# Patient Record
Sex: Male | Born: 1990 | Race: Black or African American | Hispanic: No | Marital: Single | Smoking: Former smoker
Health system: Southern US, Community
[De-identification: ages and names within clinical notes are randomized; demographics above are authoritative.]

## PROBLEM LIST (undated history)

## (undated) ENCOUNTER — Ambulatory Visit: Payer: Self-pay

## (undated) DIAGNOSIS — T7840XA Allergy, unspecified, initial encounter: Secondary | ICD-10-CM

## (undated) HISTORY — PX: ARTHROSCOPIC REPAIR ACL: SUR80

## (undated) HISTORY — DX: Allergy, unspecified, initial encounter: T78.40XA

---
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
## 2009-02-08 ENCOUNTER — Emergency Department (HOSPITAL_COMMUNITY): Admission: EM | Admit: 2009-02-08 | Discharge: 2009-02-08 | Payer: Self-pay | Admitting: Family Medicine

## 2010-05-19 LAB — POCT RAPID STREP A (OFFICE): Streptococcus, Group A Screen (Direct): POSITIVE — AB

## 2014-01-13 ENCOUNTER — Encounter (HOSPITAL_COMMUNITY): Payer: Self-pay | Admitting: Nurse Practitioner

## 2014-01-13 ENCOUNTER — Emergency Department (HOSPITAL_COMMUNITY)
Admission: EM | Admit: 2014-01-13 | Discharge: 2014-01-14 | Disposition: A | Discharge status: Home / Self Care | Payer: Self-pay | Attending: Emergency Medicine | Admitting: Emergency Medicine

## 2014-01-13 DIAGNOSIS — Z87891 Personal history of nicotine dependence: Secondary | ICD-10-CM | POA: Insufficient documentation

## 2014-01-13 DIAGNOSIS — R1031 Right lower quadrant pain: Secondary | ICD-10-CM | POA: Insufficient documentation

## 2014-01-13 DIAGNOSIS — R1903 Right lower quadrant abdominal swelling, mass and lump: Secondary | ICD-10-CM | POA: Insufficient documentation

## 2014-01-13 DIAGNOSIS — R1030 Lower abdominal pain, unspecified: Secondary | ICD-10-CM

## 2014-01-13 NOTE — ED Notes (Signed)
Pt with c/o bilateral groin pain and swelling, onset yesterday, denies fever or chills, trauma. Rates pain 6/10 post ibuprofen prior to arrival. Pt in NAD, comfortable, testing on his cell phone.

## 2014-01-14 ENCOUNTER — Emergency Department (HOSPITAL_COMMUNITY): Payer: Self-pay

## 2014-01-14 LAB — BASIC METABOLIC PANEL
Anion gap: 14 (ref 5–15)
BUN: 16 mg/dL (ref 6–23)
CO2: 26 meq/L (ref 19–32)
CREATININE: 1.14 mg/dL (ref 0.50–1.35)
Calcium: 9.4 mg/dL (ref 8.4–10.5)
Chloride: 101 mEq/L (ref 96–112)
GFR calc non Af Amer: 89 mL/min — ABNORMAL LOW (ref >90)
Glucose, Bld: 96 mg/dL (ref 70–99)
Potassium: 3.9 mEq/L (ref 3.7–5.3)
Sodium: 141 mEq/L (ref 137–147)

## 2014-01-14 LAB — CBC WITH DIFFERENTIAL/PLATELET
BASOS ABS: 0 10*3/uL (ref 0.0–0.1)
BASOS PCT: 0 % (ref 0–1)
EOS ABS: 0.1 10*3/uL (ref 0.0–0.7)
EOS PCT: 1 % (ref 0–5)
HCT: 42.6 % (ref 39.0–52.0)
Hemoglobin: 15 g/dL (ref 13.0–17.0)
Lymphocytes Relative: 28 % (ref 12–46)
Lymphs Abs: 2 10*3/uL (ref 0.7–4.0)
MCH: 31.5 pg (ref 26.0–34.0)
MCHC: 35.2 g/dL (ref 30.0–36.0)
MCV: 89.5 fL (ref 78.0–100.0)
MONO ABS: 1 10*3/uL (ref 0.1–1.0)
Monocytes Relative: 13 % — ABNORMAL HIGH (ref 3–12)
Neutro Abs: 4.3 10*3/uL (ref 1.7–7.7)
Neutrophils Relative %: 58 % (ref 43–77)
PLATELETS: 156 10*3/uL (ref 150–400)
RBC: 4.76 MIL/uL (ref 4.22–5.81)
RDW: 12.3 % (ref 11.5–15.5)
WBC: 7.3 10*3/uL (ref 4.0–10.5)

## 2014-01-14 LAB — URINALYSIS, ROUTINE W REFLEX MICROSCOPIC
Bilirubin Urine: NEGATIVE
GLUCOSE, UA: NEGATIVE mg/dL
Hgb urine dipstick: NEGATIVE
Ketones, ur: NEGATIVE mg/dL
NITRITE: NEGATIVE
PH: 6 (ref 5.0–8.0)
Protein, ur: NEGATIVE mg/dL
Specific Gravity, Urine: 1.016 (ref 1.005–1.030)
UROBILINOGEN UA: 1 mg/dL (ref 0.0–1.0)

## 2014-01-14 LAB — URINE MICROSCOPIC-ADD ON

## 2014-01-14 MED ORDER — IOHEXOL 300 MG/ML  SOLN
100.0000 mL | Freq: Once | INTRAMUSCULAR | Status: AC | PRN
  Administered 2014-01-14: 100 mL via INTRAVENOUS

## 2014-01-14 MED ORDER — OXYCODONE-ACETAMINOPHEN 5-325 MG PO TABS: 1.0000 | ORAL_TABLET | Freq: Once | ORAL | Status: DC

## 2014-01-14 MED ORDER — MORPHINE SULFATE 4 MG/ML IJ SOLN
4.0000 mg | Freq: Once | INTRAMUSCULAR | Status: AC
  Administered 2014-01-14: 4 mg via INTRAVENOUS
  Filled 2014-01-14: qty 1

## 2014-01-14 MED ORDER — AZITHROMYCIN 250 MG PO TABS
1000.0000 mg | ORAL_TABLET | Freq: Once | ORAL | Status: AC
  Administered 2014-01-14: 1000 mg via ORAL
  Filled 2014-01-14: qty 4

## 2014-01-14 MED ORDER — OXYCODONE-ACETAMINOPHEN 5-325 MG PO TABS: 1.0000 | ORAL_TABLET | ORAL | Status: DC | PRN

## 2014-01-14 MED ORDER — CEFTRIAXONE SODIUM 250 MG IJ SOLR
250.0000 mg | Freq: Once | INTRAMUSCULAR | Status: AC
  Administered 2014-01-14: 250 mg via INTRAMUSCULAR
  Filled 2014-01-14: qty 250

## 2014-01-14 MED ORDER — LIDOCAINE HCL 1 % IJ SOLN
INTRAMUSCULAR | Status: AC
  Administered 2014-01-14: 0.9 mL via INTRAMUSCULAR
  Filled 2014-01-14: qty 20

## 2014-01-14 NOTE — ED Notes (Signed)
Awake. Pt reported bil groin pain with palpation with swelling on rt side. Onset of pain started prior to taking shower and removing pants. Denies emesis/diarrhea. Reported nausea and lightheadedness with pain. Denies heavy lifting.

## 2014-01-14 NOTE — Discharge Instructions (Signed)

## 2014-01-14 NOTE — ED Notes (Signed)
MD at bedside. 

## 2014-01-14 NOTE — ED Notes (Signed)
Awake. Verbally responsive. A/O x4. Resp even and unlabored. No audible adventitious breath sounds noted. ABC's intact. NAD noted. 

## 2014-01-14 NOTE — ED Notes (Signed)
Resting quietly with eye closed. Easily arousable. Verbally responsive. Resp even and unlabored. ABC's intact. NAD noted.  

## 2014-01-14 NOTE — ED Notes (Signed)
Pt had no adverse reaction to IM ABT.

## 2014-01-14 NOTE — ED Notes (Signed)
Patient returned from CT scan without distress noted. 

## 2014-01-14 NOTE — ED Provider Notes (Signed)
CSN: [id number]     Arrival date & time 01/13/14  2200 History   First MD Initiated Contact with Patient 01/14/14 0117     Chief Complaint  Patient presents with  . Groin Pain  . Groin Swelling     (Consider location/radiation/quality/duration/timing/severity/associated sxs/prior Treatment) Patient is a 23 y.o. male presenting with groin pain. The history is provided by the patient. No language interpreter was used.  Groin Pain This is a new problem. Pertinent negatives include no abdominal pain, chills, fever, nausea or vomiting. Associated symptoms comments: Pain and swelling in right groin for the past day. No fever. No dysuria, hematuria, scrotal swelling or testicular pain. He denies penile discharge. No abdominal pain or nausea/vomiting.Marland Kitchen.    History reviewed. No pertinent past medical history. History reviewed. No pertinent past surgical history. History reviewed. No pertinent family history. History  Substance Use Topics  . Smoking status: Former Games developermoker  . Smokeless tobacco: Never Used  . Alcohol Use: No    Review of Systems  Constitutional: Negative for fever and chills.  Gastrointestinal: Negative.  Negative for nausea, vomiting and abdominal pain.  Genitourinary: Negative for dysuria, hematuria, discharge, scrotal swelling, penile pain and testicular pain.       See HPI.  Musculoskeletal: Negative.   Skin: Negative.   Neurological: Negative.       Allergies  Shellfish allergy  Home Medications   Prior to Admission medications   Not on File   BP 118/69 mmHg  Pulse 57  Temp(Src) 98.3 F (36.8 C) (Oral)  Resp 18  Ht 6\' 2"  (1.88 m)  Wt 165 lb (74.844 kg)  BMI 21.18 kg/m2  SpO2 100% Physical Exam  Constitutional: He is oriented to person, place, and time. He appears well-developed and well-nourished.  Neck: Normal range of motion.  Pulmonary/Chest: Effort normal.  Abdominal: There is no tenderness.  Genitourinary:  Circumcised. No scrotal swelling or  testicular tenderness. Right groin at base of penis is swollen, significantly tender. Exam limited by tenderness, but no discrete mass, or fluctuance appreciated.   Musculoskeletal: Normal range of motion.  Neurological: He is alert and oriented to person, place, and time.  Skin: Skin is warm and dry.  Psychiatric: He has a normal mood and affect.    ED Course  Procedures (including critical care time) Labs Review Labs Reviewed  URINALYSIS, ROUTINE W REFLEX MICROSCOPIC - Abnormal; Notable for the following:    APPearance CLOUDY (*)    Leukocytes, UA MODERATE (*)    All other components within normal limits  CBC WITH DIFFERENTIAL - Abnormal; Notable for the following:    Monocytes Relative 13 (*)    All other components within normal limits  BASIC METABOLIC PANEL - Abnormal; Notable for the following:    GFR calc non Af Amer 89 (*)    All other components within normal limits  GC/CHLAMYDIA PROBE AMP  URINE MICROSCOPIC-ADD ON    Imaging Review Ct Pelvis W Contrast  01/14/2014   CLINICAL DATA:  Acute onset of right groin swelling and bilateral groin pain. Initial encounter.  EXAM: CT PELVIS WITH CONTRAST  TECHNIQUE: Multidetector CT imaging of the pelvis was performed using the standard protocol following the bolus administration of intravenous contrast.  CONTRAST:  100mL OMNIPAQUE IOHEXOL 300 MG/ML  SOLN  COMPARISON:  None.  FINDINGS: Asymmetrically prominent right inguinal nodes are seen, measuring up to 1.1 cm in short axis. There is mild associated soft tissue inflammation. This is of uncertain significance. There may be mild  edema with regard to the adjacent right inguinal canal.  There is slight asymmetric prominence of the right side of the scrotum, raising question for mild scrotal wall edema or possibly epididymitis. Would correlate for associated symptoms.  There is no evidence of vascular compromise. The visualized portions of the pelvis are unremarkable in appearance. No pelvic  sidewall lymphadenopathy is seen. No free fluid is identified. Visualized small and large bowel loops are grossly unremarkable. The appendix is normal in caliber, without evidence for appendicitis. The prostate is grossly unremarkable. The bladder is mildly distended and within normal limits.  No acute osseous abnormalities are seen. The visualized musculature is unremarkable in appearance.  IMPRESSION: 1. Asymmetrically prominent right inguinal nodes, measuring up to 1.1 cm in short axis. Mild associated soft tissue inflammation, of uncertain significance. There may be mild edema with regard to the adjacent right inguinal canal. 2. Slight asymmetric prominence of the right side of the scrotum, raising question for mild scrotal wall edema or possibly epididymitis. Would correlate for associated symptoms. 3. Otherwise unremarkable CT of the pelvis.   Electronically Signed   By: Roanna RaiderJeffery  Chang M.D.   On: 01/14/2014 03:58     EKG Interpretation None      MDM   Final diagnoses:  Groin pain    GC/Chlamydia cultures pending. CT scan showing adenopathy without abscess or hernia. Zithromax and Rocephin provided. Will also provide pain management. All questions of patient answered.     Arnoldo HookerShari A Jeanet Lupe, PA-C 01/14/14 0500  Purvis SheffieldForrest Harrison, MD 01/14/14 [id number]

## 2014-01-14 NOTE — ED Notes (Signed)
Patient transported to CT 

## 2014-01-16 LAB — GC/CHLAMYDIA PROBE AMP
CT Probe RNA: POSITIVE — AB
GC Probe RNA: NEGATIVE

## 2014-01-17 ENCOUNTER — Telehealth: Payer: Self-pay | Admitting: Emergency Medicine

## 2014-01-17 NOTE — Telephone Encounter (Signed)
pt returned call. ID verified. Notified of positive Chlamydia and that treatment was given while in ED with Rocephin and zithromax. STD instructions provided, pt verbalized understanding.

## 2014-01-17 NOTE — Telephone Encounter (Signed)
Positive Chlamydia Treated with Rocephin and Zithromax per protocol MD [facility] faxed  Left message for patient to call office #

## 2014-04-15 ENCOUNTER — Encounter (HOSPITAL_COMMUNITY): Payer: Self-pay | Admitting: Physical Medicine and Rehabilitation

## 2014-04-15 ENCOUNTER — Emergency Department (HOSPITAL_COMMUNITY)
Admission: EM | Admit: 2014-04-15 | Discharge: 2014-04-15 | Disposition: A | Discharge status: Home / Self Care | Payer: Self-pay | Attending: Emergency Medicine | Admitting: Emergency Medicine

## 2014-04-15 DIAGNOSIS — Z87891 Personal history of nicotine dependence: Secondary | ICD-10-CM | POA: Insufficient documentation

## 2014-04-15 DIAGNOSIS — Y92310 Basketball court as the place of occurrence of the external cause: Secondary | ICD-10-CM | POA: Insufficient documentation

## 2014-04-15 DIAGNOSIS — W500XXA Accidental hit or strike by another person, initial encounter: Secondary | ICD-10-CM | POA: Insufficient documentation

## 2014-04-15 DIAGNOSIS — Y998 Other external cause status: Secondary | ICD-10-CM | POA: Insufficient documentation

## 2014-04-15 DIAGNOSIS — S01111A Laceration without foreign body of right eyelid and periocular area, initial encounter: Secondary | ICD-10-CM | POA: Insufficient documentation

## 2014-04-15 DIAGNOSIS — Y9367 Activity, basketball: Secondary | ICD-10-CM | POA: Insufficient documentation

## 2014-04-15 NOTE — ED Notes (Signed)
Pt awake, talking more, smiling, Wanting to leave to go get something to eat and bandaid to cover wound.  Bandaid applied.

## 2014-04-15 NOTE — Discharge Instructions (Signed)
Open Wound, Forehead °An open wound is a break in the skin because of an injury. An open wound can be a scrape, cut, or puncture to the skin. Good wound care will help to:  °· Reduce pain. °· Prevent infection. °· Reduce scaring. °HOME CARE °· Wash all dirt off the wound. °· Clean your wound daily with gentle soap and water. °· Wash your hair as you normally do. °· Apply medicated cream after the wound has been cleaned or as told by your doctor. °· Apply a clean bandage (dressing) daily if necessary. °GET HELP RIGHT AWAY IF:  °· There is increased redness or puffiness (swelling) in or around the wound. °· Your pain increases. °· You or your child has a temperature by mouth above 102° F (38.9° C), not controlled by medicine. °· Your baby is older than 3 months with a rectal temperature of 102° F (38.9° C) or higher. °· Your baby is 3 months old or younger with a rectal temperature of 100.4° F (38° C) or higher. °· A yellowish white fluid (pus) comes from the wound. °· Your pain is not controlled with pain medicine. °· There is red streaking of the skin that goes above or below the wound. °MAKE SURE YOU:  °· Understand these instructions. °· Will watch your condition. °· Will get help right away if you are not doing well or get worse. °Document Released: 05/01/2008 Document Revised: 04/27/2011 Document Reviewed: 05/01/2008 °Exit[employer]® Patient Information ©2015 [employer]. This information is not intended to replace advice given to you by your health care provider. Make sure you discuss any questions you have with your health care provider. ° °

## 2014-04-15 NOTE — ED Provider Notes (Signed)
CSN: [id number]     Arrival date & time 04/15/14  1844 History  This chart was scribed for non-physician practitioner, Jose Helper, PA-C,working with Jose Roller, MD, by Jose Mullins, ED Scribe. This patient was seen in room TR04C/TR04C and the patient's care was started at 8:13 PM.  Chief Complaint  Patient presents with  . Laceration   Patient is a 24 y.o. male presenting with skin laceration. The history is provided by the patient and medical records. No language interpreter was used.  Laceration   HPI Comments:  Jose Mullins is a 24 y.o. male who presents to the Emergency Department complaining of a laceration to the right eyebrow that he sustained while playing basketball approximately three hours ago. Pt states another player's head came into contact with his and caused the laceration. He reports a severe headache and facial pain. Pt reports being very lethargic and tired. Denies modifying factors. Pt is unaware of the last tetanus vaccination he received. Denies LOC, loss of vision, neck pain, back pain, nausea or vomiting. He denies falling to the ground. Denies any illicit drug use or alcohol use. Pt is ambulatory without issue.  History reviewed. No pertinent past medical history. History reviewed. No pertinent past surgical history. No family history on file. History  Substance Use Topics  . Smoking status: Former Games developer  . Smokeless tobacco: Never Used  . Alcohol Use: No    Review of Systems  Skin: Positive for wound.  Neurological: Negative for syncope.    Allergies  Shellfish allergy  Home Medications   Prior to Admission medications   Medication Sig Start Date End Date Taking? Authorizing Provider  oxyCODONE-acetaminophen (PERCOCET/ROXICET) 5-325 MG per tablet Take 1-2 tablets by mouth every 4 (four) hours as needed for severe pain. 01/14/14   Arnoldo Hooker, PA-C   Triage Vitals: BP 111/69 mmHg  Pulse 82  Temp(Src) 98.8 F (37.1 C) (Oral)  Resp 18   SpO2 98% Physical Exam  Constitutional: He is oriented to person, place, and time. He appears well-developed and well-nourished.  HENT:  Head: Normocephalic.  2 cm superficial laceration noted to right eyebrow. No foreign object noted.  Eyes: EOM are normal.  Neck: Normal range of motion.  Cardiovascular: Normal rate.   Pulmonary/Chest: Effort normal.  Musculoskeletal: Normal range of motion.  Neurological: He is alert and oriented to person, place, and time.  Normal gait. No focal neural deficits. Able to answer all questions appropriately.  Skin: Skin is warm and dry.  Psychiatric: He has a normal mood and affect. His behavior is normal.  Nursing note and vitals reviewed.   ED Course  Procedures (including critical care time) DIAGNOSTIC STUDIES: Oxygen Saturation is 98% on RA, normal by my interpretation.   COORDINATION OF CARE: 8:19 PM- Offered a CT scan of the head but pt declined. Will give Ibuprofen prior to discharge and clean wound and apply steri-strips. Pt verbalizes understanding and agrees to plan.  Medications - No data to display  Labs Review Labs Reviewed - No data to display  Imaging Review No results found.   EKG Interpretation None      MDM   Final diagnoses:  Eyebrow laceration, right, initial encounter    BP 111/69 mmHg  Pulse 82  Temp(Src) 98.8 F (37.1 C) (Oral)  Resp 18  SpO2 98%   I personally performed the services described in this documentation, which was scribed in my presence. The recorded information has been reviewed and is accurate.  Jose HelperBowie Sammantha Mehlhaff, PA-C 04/15/14 2036  Jose RollerBrian D Miller, MD 04/16/14 2024

## 2014-04-15 NOTE — ED Notes (Signed)
Pt requesting something to eat.  Reports he doesn't know what time last eaten today.   Per PA no food/drink until evaluated.

## 2014-04-15 NOTE — ED Notes (Signed)
Pt presents to department for evaluation of R eye laceration. States she collided with another player during basketball game, 1 cm laceration noted to R eyebrow. Bleeding controlled. Denies LOC. Pt is alert and oriented x4.

## 2015-06-27 ENCOUNTER — Emergency Department (HOSPITAL_COMMUNITY)
Admission: EM | Admit: 2015-06-27 | Discharge: 2015-06-27 | Disposition: A | Discharge status: Home / Self Care | Payer: Self-pay | Attending: Emergency Medicine | Admitting: Emergency Medicine

## 2015-06-27 ENCOUNTER — Encounter (HOSPITAL_COMMUNITY): Payer: Self-pay | Admitting: Emergency Medicine

## 2015-06-27 DIAGNOSIS — Z87891 Personal history of nicotine dependence: Secondary | ICD-10-CM | POA: Insufficient documentation

## 2015-06-27 DIAGNOSIS — Z79891 Long term (current) use of opiate analgesic: Secondary | ICD-10-CM | POA: Insufficient documentation

## 2015-06-27 DIAGNOSIS — Z113 Encounter for screening for infections with a predominantly sexual mode of transmission: Secondary | ICD-10-CM | POA: Insufficient documentation

## 2015-06-27 MED ORDER — METRONIDAZOLE 500 MG PO TABS
2000.0000 mg | ORAL_TABLET | Freq: Once | ORAL | Status: AC
  Administered 2015-06-27: 2000 mg via ORAL
  Filled 2015-06-27: qty 4

## 2015-06-27 NOTE — ED Provider Notes (Signed)
CSN: [id number]     Arrival date & time 06/27/15  1137 History  By signing my name below, I, Placido SouLogan Joldersma, attest that this documentation has been prepared under the direction and in the presence of Kainat Pizana Y Pavielle Biggar, [place]-C. Electronically Signed: Placido SouLogan Joldersma, ED Scribe. 06/27/2015. 12:14 PM.   Chief Complaint  Patient presents with  . STD check    The history is provided by the patient and the spouse. No language interpreter was used.   HPI Comments: Jose Mullins is a 25 y.o. male who is otherwise healthy presents to the Emergency Department requesting an STD screening. His male partner states that she recently tested positive for trichomonas noting that he has been her only sexual partner for a long period of time. Pt denies penile discharge, rashes, abd pain, n/v/d, fevers, chills or any current symptoms.   History reviewed. No pertinent past medical history. History reviewed. No pertinent past surgical history. No family history on file. Social History  Substance Use Topics  . Smoking status: Former Games developermoker  . Smokeless tobacco: Never Used  . Alcohol Use: No    Review of Systems  A complete 10 system review of systems was obtained and all systems are negative except as noted in the HPI and PMH.   Allergies  Shellfish allergy  Home Medications   Prior to Admission medications   Medication Sig Start Date End Date Taking? Authorizing Provider  oxyCODONE-acetaminophen (PERCOCET/ROXICET) 5-325 MG per tablet Take 1-2 tablets by mouth every 4 (four) hours as needed for severe pain. 01/14/14   Shari Upstill, PA-C   BP 111/68 mmHg  Pulse 54  Temp(Src) 97.8 F (36.6 C) (Oral)  Resp 18  SpO2 100%    Physical Exam  Constitutional: He is oriented to person, place, and time. He appears well-developed and well-nourished.  HENT:  Head: Normocephalic and atraumatic.  Eyes: EOM are normal.  Neck: Normal range of motion.  Cardiovascular: Normal rate.   Pulmonary/Chest: Effort  normal. No respiratory distress.  Abdominal: Soft.  Genitourinary:  Pt declines  Musculoskeletal: Normal range of motion.  Neurological: He is alert and oriented to person, place, and time.  Skin: Skin is warm and dry.  Psychiatric: He has a normal mood and affect.  Nursing note and vitals reviewed.   ED Course  Procedures  DIAGNOSTIC STUDIES: Oxygen Saturation is 100% on RA, normal by my interpretation.    COORDINATION OF CARE: 12:10 PM Discussed next steps with pt. He verbalized understanding and is agreeable with the plan.   Labs Review Labs Reviewed - No data to display  Imaging Review No results found. I have personally reviewed and evaluated these lab results as part of my medical decision-making.   EKG Interpretation None      MDM   Final diagnoses:  Encounter for screening examination for sexually transmitted disease    Will send urine gc/chlamydia as well as HIV/RPR. Will treat empirically for trichomoniasis. Encouraged safe sex practices and abstinence for at least one week. ER return precautions given.   I personally performed the services described in this documentation, which was scribed in my presence. The recorded information has been reviewed and is accurate.   Carlene CoriaSerena Y Xiomar Crompton, PA-C 06/27/15 1253  Loren Raceravid Yelverton, MD 06/27/15 [phone]

## 2015-06-27 NOTE — ED Notes (Signed)
Per pt, states he wants to be checked for STDs-no symptoms

## 2015-06-27 NOTE — Discharge Instructions (Signed)
We treated you empirically for trichomoniasis today. We will call you if any of your tests come back positive. Please practice safe sex and always use a condom to prevent sexually transmitted infections. Please abstain from sex for at least one week. Return to the emergency room for new or worsening symptoms.

## 2015-06-28 LAB — GC/CHLAMYDIA PROBE AMP (~~LOC~~) NOT AT ARMC
Chlamydia: NEGATIVE
Neisseria Gonorrhea: NEGATIVE

## 2015-06-28 LAB — HIV ANTIBODY (ROUTINE TESTING W REFLEX): HIV SCREEN 4TH GENERATION: NONREACTIVE

## 2015-06-28 LAB — RPR: RPR Ser Ql: NONREACTIVE

## 2015-12-15 IMAGING — CT CT PELVIS W/ CM
1 series · 15 of 32 positions shown, 19 images · IV contrast (omnipaque)
Comparison: None.

CLINICAL DATA: Acute onset of right groin swelling and bilateral
groin pain. Initial encounter.

EXAM:
CT PELVIS WITH CONTRAST
TECHNIQUE: Multidetector CT imaging of the pelvis was performed using the
standard protocol following the bolus administration of intravenous
contrast.
CONTRAST:  100mL OMNIPAQUE IOHEXOL 300 MG/ML  SOLN

[Series 2: pelvis with · axial · 0.74mm/px · z∈[-342,-62]mm · 15 of 64 slices shown, 19 images]
[im 5/64  soft-tissue]
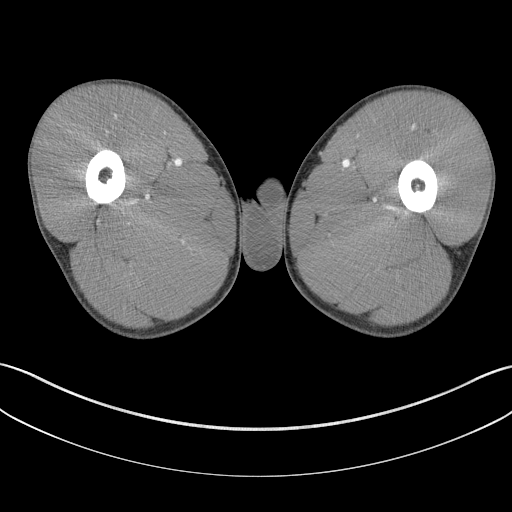
[im 5/64  bone]
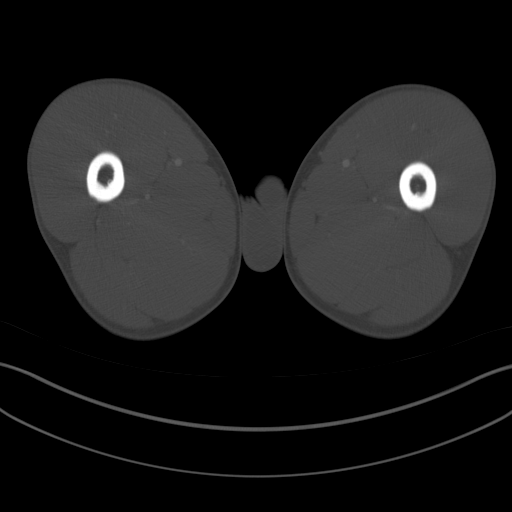
[im 9/64  soft-tissue]
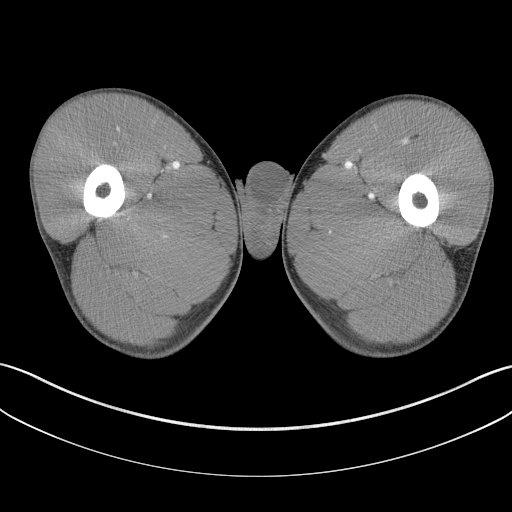
[im 13/64  soft-tissue]
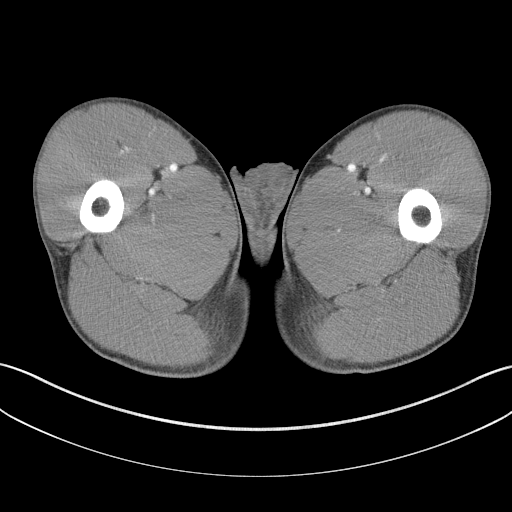
[im 19/64  soft-tissue]
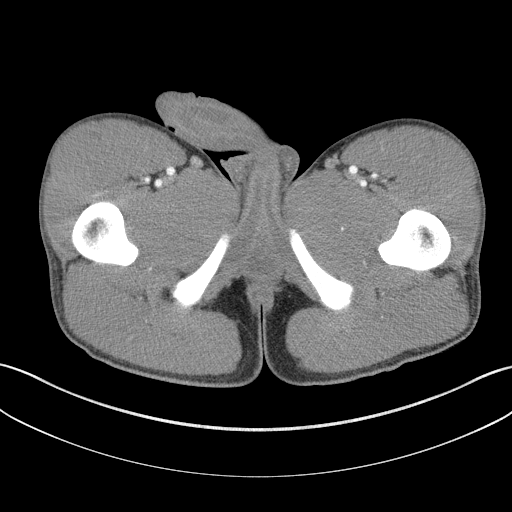
[im 23/64  soft-tissue]
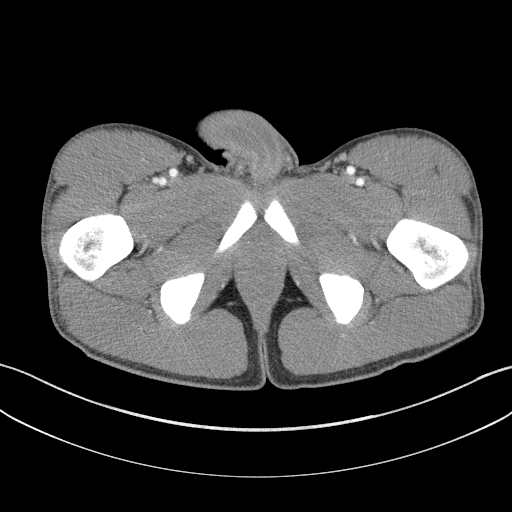
[im 27/64  soft-tissue]
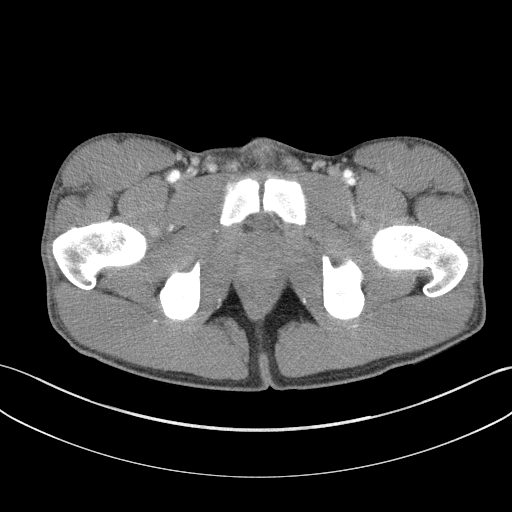
[im 33/64  soft-tissue]
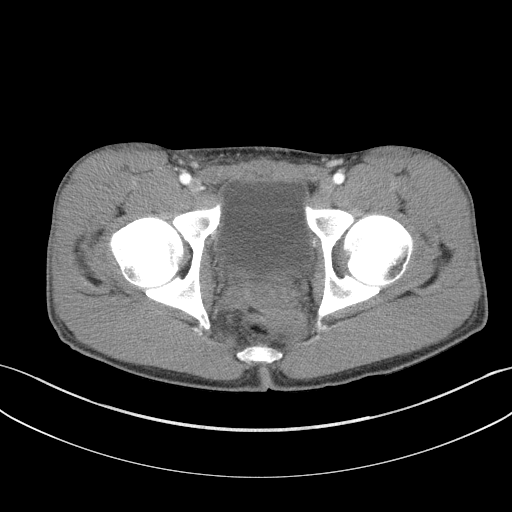
[im 37/64  soft-tissue]
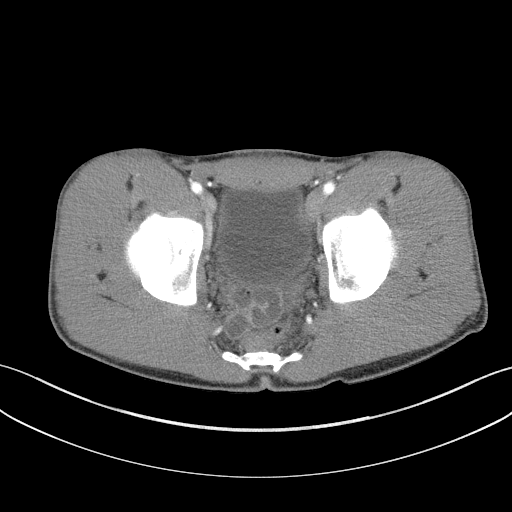
[im 41/64  soft-tissue]
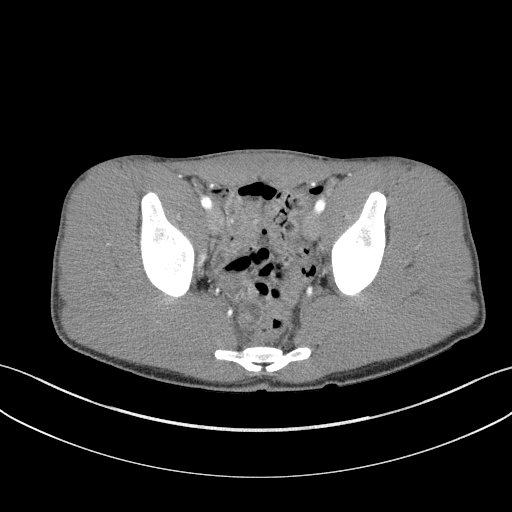
[im 41/64  bone]
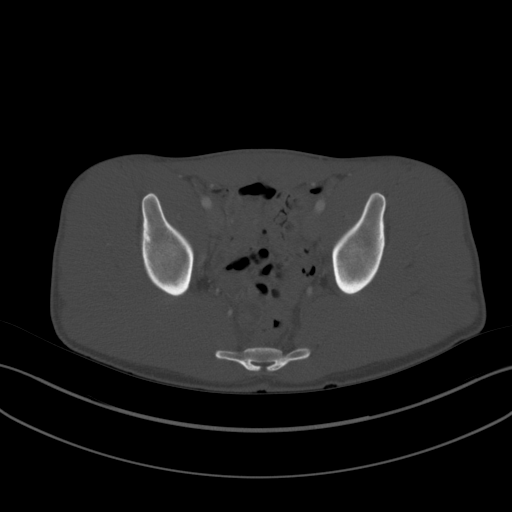
[im 45/64  soft-tissue]
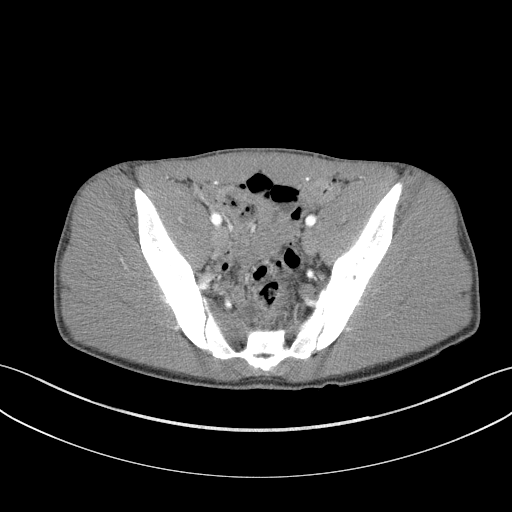
[im 51/64  soft-tissue]
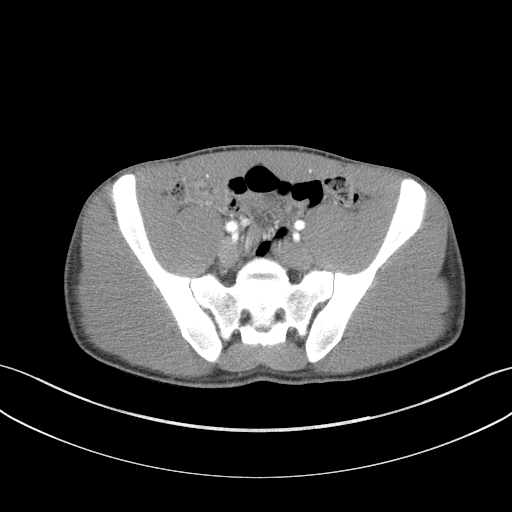
[im 55/64  soft-tissue]
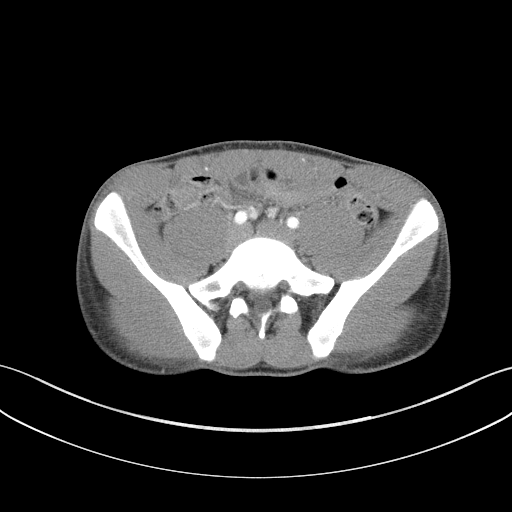
[im 55/64  lung]
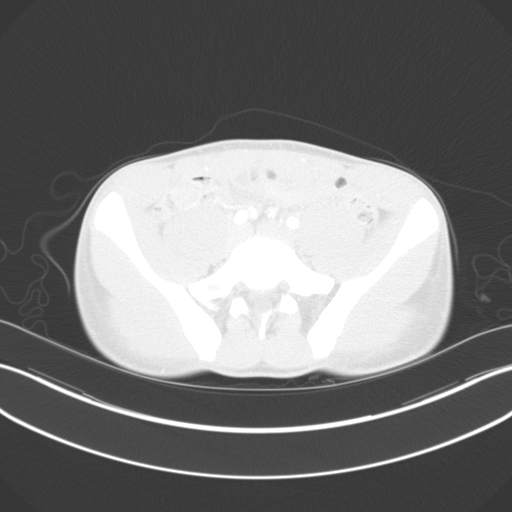
[im 57/64  lung]
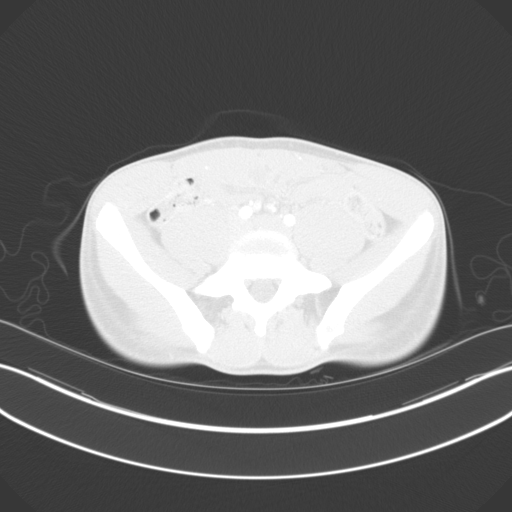
[im 59/64  soft-tissue]
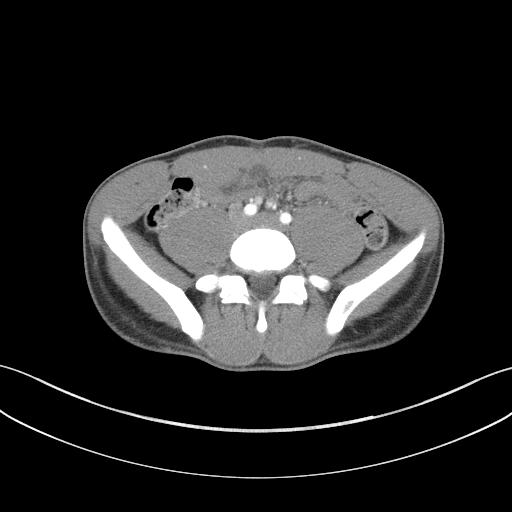
[im 59/64  lung]
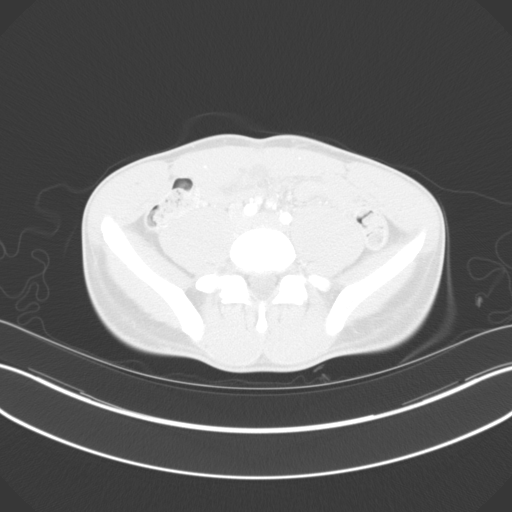
[im 61/64  lung]
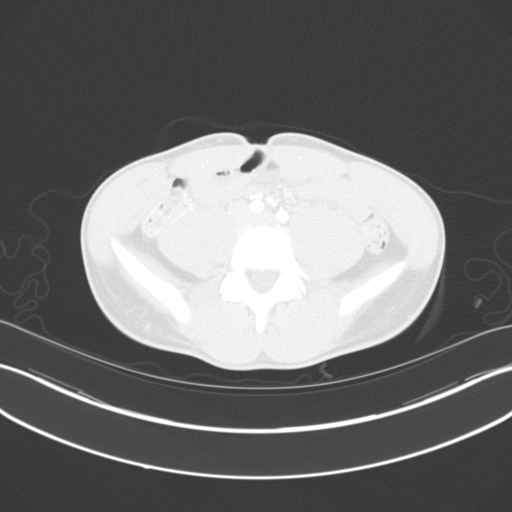

[15 of 32 positions shown; findings below may reference images not displayed]

FINDINGS: Asymmetrically prominent right inguinal nodes are seen, measuring up
to 1.1 cm in short axis. There is mild associated soft tissue
inflammation. This is of uncertain significance. There may be mild
edema with regard to the adjacent right inguinal canal.

There is slight asymmetric prominence of the right side of the
scrotum, raising question for mild scrotal wall edema or possibly
epididymitis. Would correlate for associated symptoms.

There is no evidence of vascular compromise. The visualized portions
of the pelvis are unremarkable in appearance. No pelvic sidewall
lymphadenopathy is seen. No free fluid is identified. Visualized
small and large bowel loops are grossly unremarkable. The appendix
is normal in caliber, without evidence for appendicitis. The
prostate is grossly unremarkable. The bladder is mildly distended
and within normal limits.

No acute osseous abnormalities are seen. The visualized musculature
is unremarkable in appearance.
IMPRESSION: 1. Asymmetrically prominent right inguinal nodes, measuring up to
1.1 cm in short axis. Mild associated soft tissue inflammation, of
uncertain significance. There may be mild edema with regard to the
adjacent right inguinal canal.
2. Slight asymmetric prominence of the right side of the scrotum,
raising question for mild scrotal wall edema or possibly
epididymitis. Would correlate for associated symptoms.
3. Otherwise unremarkable CT of the pelvis.

## 2016-01-11 ENCOUNTER — Encounter (HOSPITAL_COMMUNITY): Payer: Self-pay | Admitting: *Deleted

## 2016-01-11 ENCOUNTER — Emergency Department (HOSPITAL_COMMUNITY)
Admission: EM | Admit: 2016-01-11 | Discharge: 2016-01-11 | Disposition: A | Discharge status: Home / Self Care | Payer: Self-pay | Attending: Emergency Medicine | Admitting: Emergency Medicine

## 2016-01-11 DIAGNOSIS — K0889 Other specified disorders of teeth and supporting structures: Secondary | ICD-10-CM | POA: Insufficient documentation

## 2016-01-11 DIAGNOSIS — F17200 Nicotine dependence, unspecified, uncomplicated: Secondary | ICD-10-CM | POA: Insufficient documentation

## 2016-01-11 MED ORDER — IBUPROFEN 600 MG PO TABS: 600.0000 mg | ORAL_TABLET | Freq: Four times a day (QID) | ORAL | 0 refills | Status: DC | PRN

## 2016-01-11 MED ORDER — PENICILLIN V POTASSIUM 500 MG PO TABS: 500.0000 mg | ORAL_TABLET | Freq: Four times a day (QID) | ORAL | 0 refills | Status: DC

## 2016-01-11 NOTE — Discharge Instructions (Signed)
Please take medicines and follow up with a dentist. A list of dentist has been provided in your paperwork

## 2016-01-11 NOTE — ED Notes (Signed)
Patient is alert and oriented x3.  He was given DC instructions and follow up visit instructions.  Patient gave verbal understanding.  He was DC ambulatory under his own power to home.  V/S stable.  He was not showing any signs of distress on DC 

## 2016-01-11 NOTE — ED Triage Notes (Signed)
Patient is alert and oriented x4.  He is complaining of dental pain that started two days ago.  Patient states that both the upper and lower teeth on the right side are throbbing.  Currently he rates his pain 10 of 10.

## 2016-01-11 NOTE — ED Provider Notes (Signed)
[facility] Provider Note   CSN: [id number] Arrival date & time: 01/11/16  1259 By signing my name below, I, Bridgette HabermannMaria Tan, attest that this documentation has been prepared under the direction and in the presence of Terance HartKelly Gasper Hopes, PA-C. Electronically Signed: Bridgette HabermannMaria Tan, ED Scribe. 01/11/16. 1:36 PM.  History   Chief Complaint Chief Complaint  Patient presents with  . Dental Pain   HPI Comments: Jose Mullins is a 25 y.o. male with no pertinent PMHx, who presents to the Emergency Department complaining of throbbing, 10/10 upper and lower dental pain onset two days ago. Pt denies any drainage. Pt states he has not been able to eat secondary to the pain. Pain is exacerbated with chewing and talking. He has not taken any OTC medications PTA. Pt does not have a dentist he regularly follows up with. Pt denies fever, chills, drooling, facial swelling, or any other associated symptoms.   The history is provided by the patient. No language interpreter was used.    History reviewed. No pertinent past medical history.  There are no active problems to display for this patient.   History reviewed. No pertinent surgical history.     Home Medications    Prior to Admission medications   Medication Sig Start Date End Date Taking? Authorizing Provider  oxyCODONE-acetaminophen (PERCOCET/ROXICET) 5-325 MG per tablet Take 1-2 tablets by mouth every 4 (four) hours as needed for severe pain. 01/14/14   Elpidio AnisShari Upstill, PA-C    Family History History reviewed. No pertinent family history.  Social History Social History  Substance Use Topics  . Smoking status: Light Tobacco Smoker  . Smokeless tobacco: Never Used  . Alcohol use Yes     Allergies   Shellfish allergy   Review of Systems Review of Systems  Constitutional: Negative for chills and fever.  HENT: Positive for dental problem and trouble swallowing. Negative for facial swelling.    Physical Exam Updated Vital Signs BP  103/72 (BP Location: Right Arm)   Pulse (!) 56   Temp 98.7 F (37.1 C) (Oral)   Resp 18   Ht 6\' 2"  (1.88 m)   Wt 170 lb (77.1 kg)   SpO2 99%   BMI 21.83 kg/m   Physical Exam  Constitutional: He appears well-developed and well-nourished. No distress.  HENT:  Head: Normocephalic and atraumatic.  Mouth/Throat: No oral lesions. There is trismus (Mild ) in the jaw. Normal dentition. No dental abscesses or dental caries.  Tenderness with percussion of upper and lower right molars  Eyes: Conjunctivae are normal.  Cardiovascular: Normal rate.   Pulmonary/Chest: Effort normal. No respiratory distress.  Abdominal: He exhibits no distension.  Musculoskeletal: Normal range of motion.  Neurological: He is alert.  Skin: Skin is warm and dry.  Psychiatric: He has a normal mood and affect. His behavior is normal.  Nursing note and vitals reviewed.  ED Treatments / Results  DIAGNOSTIC STUDIES: Oxygen Saturation is 99% on RA, normal by my interpretation.    COORDINATION OF CARE: 1:36 PM Discussed treatment plan with pt at bedside which includes Rx of anti-inflammatories and antibiotic and pt agreed to plan.  Labs (all labs ordered are listed, but only abnormal results are displayed) Labs Reviewed - No data to display  EKG  EKG Interpretation None       Radiology No results found.  Procedures Procedures (including critical care time)  Medications Ordered in ED Medications - No data to display   Initial Impression / Assessment and Plan / ED  Course  I have reviewed the triage vital signs and the nursing notes.  Pertinent labs & imaging results that were available during my care of the patient were reviewed by me and considered in my medical decision making (see chart for details).  Clinical Course    Patient with dentalgia.  No abscess requiring immediate incision and drainage. Exam not concerning for Ludwig's angina or pharyngeal abscess. Will treat with an Rx of antibiotics  and anti-inflammatories. Pt instructed to follow-up with dentist.  Discussed return precautions. Pt safe for discharge.  Final Clinical Impressions(s) / ED Diagnoses   Final diagnoses:  Pain, dental    New Prescriptions New Prescriptions   No medications on file   I personally performed the services described in this documentation, which was scribed in my presence. The recorded information has been reviewed and is accurate.     Jose BornKelly Marie Maze Corniel, PA-C 01/11/16 1343    Jose Pollackameron Isaacs, MD 01/12/16 [phone]

## 2016-07-13 ENCOUNTER — Encounter (HOSPITAL_COMMUNITY): Payer: Self-pay | Admitting: Nurse Practitioner

## 2016-07-13 ENCOUNTER — Emergency Department (HOSPITAL_COMMUNITY)
Admission: EM | Admit: 2016-07-13 | Discharge: 2016-07-13 | Disposition: A | Discharge status: Home / Self Care | Payer: No Typology Code available for payment source | Attending: Emergency Medicine | Admitting: Emergency Medicine

## 2016-07-13 ENCOUNTER — Emergency Department (HOSPITAL_COMMUNITY): Payer: No Typology Code available for payment source

## 2016-07-13 DIAGNOSIS — Z79899 Other long term (current) drug therapy: Secondary | ICD-10-CM | POA: Diagnosis not present

## 2016-07-13 DIAGNOSIS — S40811A Abrasion of right upper arm, initial encounter: Secondary | ICD-10-CM | POA: Insufficient documentation

## 2016-07-13 DIAGNOSIS — Y939 Activity, unspecified: Secondary | ICD-10-CM | POA: Insufficient documentation

## 2016-07-13 DIAGNOSIS — S40812A Abrasion of left upper arm, initial encounter: Secondary | ICD-10-CM | POA: Diagnosis not present

## 2016-07-13 DIAGNOSIS — Y999 Unspecified external cause status: Secondary | ICD-10-CM | POA: Insufficient documentation

## 2016-07-13 DIAGNOSIS — F17200 Nicotine dependence, unspecified, uncomplicated: Secondary | ICD-10-CM | POA: Diagnosis not present

## 2016-07-13 DIAGNOSIS — S20319A Abrasion of unspecified front wall of thorax, initial encounter: Secondary | ICD-10-CM | POA: Insufficient documentation

## 2016-07-13 DIAGNOSIS — Y92410 Unspecified street and highway as the place of occurrence of the external cause: Secondary | ICD-10-CM | POA: Diagnosis not present

## 2016-07-13 DIAGNOSIS — M7918 Myalgia, other site: Secondary | ICD-10-CM

## 2016-07-13 DIAGNOSIS — S299XXA Unspecified injury of thorax, initial encounter: Secondary | ICD-10-CM | POA: Diagnosis present

## 2016-07-13 DIAGNOSIS — T07XXXA Unspecified multiple injuries, initial encounter: Secondary | ICD-10-CM

## 2016-07-13 MED ORDER — IBUPROFEN 600 MG PO TABS: 600.0000 mg | ORAL_TABLET | Freq: Four times a day (QID) | ORAL | 0 refills | Status: DC | PRN

## 2016-07-13 MED ORDER — TRAMADOL HCL 50 MG PO TABS: 50.0000 mg | ORAL_TABLET | Freq: Four times a day (QID) | ORAL | 0 refills | Status: DC | PRN

## 2016-07-13 MED ORDER — TRAMADOL HCL 50 MG PO TABS
50.0000 mg | ORAL_TABLET | Freq: Once | ORAL | Status: AC
  Administered 2016-07-13: 50 mg via ORAL
  Filled 2016-07-13: qty 1

## 2016-07-13 NOTE — ED Provider Notes (Signed)
Medical screening examination/treatment/procedure(s) were performed by non-physician practitioner and as supervising physician I was immediately available for consultation/collaboration.   EKG Interpretation None        Nonna Renninger, MD 07/13/16 2328
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
## 2018-08-01 ENCOUNTER — Ambulatory Visit (INDEPENDENT_AMBULATORY_CARE_PROVIDER_SITE_OTHER): Payer: Self-pay

## 2018-08-01 ENCOUNTER — Encounter (HOSPITAL_COMMUNITY): Payer: Self-pay

## 2018-08-01 ENCOUNTER — Ambulatory Visit (HOSPITAL_COMMUNITY): Payer: Self-pay

## 2018-08-01 ENCOUNTER — Other Ambulatory Visit: Payer: Self-pay

## 2018-08-01 ENCOUNTER — Ambulatory Visit (HOSPITAL_COMMUNITY)
Admission: EM | Admit: 2018-08-01 | Discharge: 2018-08-01 | Disposition: A | Discharge status: Home / Self Care | Payer: Self-pay | Attending: Family Medicine | Admitting: Family Medicine

## 2018-08-01 DIAGNOSIS — S6991XA Unspecified injury of right wrist, hand and finger(s), initial encounter: Secondary | ICD-10-CM

## 2018-08-01 NOTE — ED Triage Notes (Signed)
Patient presents to Urgent Care with complaints of right wrist pain since falling on it last night. Patient reports he thought he would be okay to work today, but the pain was worse than last night.

## 2018-08-01 NOTE — ED Provider Notes (Signed)
[facility]    CSN: [id number] Arrival date & time: 08/01/18  0854     History   Chief Complaint Chief Complaint  Patient presents with  . Wrist Injury    HPI Jose Mullins is a 28 y.o. male.   Patient is a 28 year old male who presents today with right hand pain, swelling and injury.  Symptoms have been constant.  Reports fell on it last night.  He has not taken any medication for his symptoms.  He has not iced the hand.  Reporting unable to go to work today due to the pain.  No numbness, tingling.  ROS per HPI      History reviewed. No pertinent past medical history.  There are no active problems to display for this patient.   History reviewed. No pertinent surgical history.     Home Medications    Prior to Admission medications   Not on File    Family History Family History  Problem Relation Age of Onset  . Cancer Mother   . HIV/AIDS Mother   . Healthy Father     Social History Social History   Tobacco Use  . Smoking status: Light Tobacco Smoker  . Smokeless tobacco: Never Used  Substance Use Topics  . Alcohol use: Yes  . Drug use: Not on file     Allergies   Shellfish allergy   Review of Systems Review of Systems   Physical Exam Triage Vital Signs ED Triage Vitals  Enc Vitals Group     BP 08/01/18 0916 122/67     Pulse Rate 08/01/18 0916 (!) 59     Resp 08/01/18 0916 19     Temp 08/01/18 0916 97.9 F (36.6 C)     Temp Source 08/01/18 0916 Oral     SpO2 08/01/18 0916 100 %     Weight --      Height --      Head Circumference --      Peak Flow --      Pain Score 08/01/18 0914 9     Pain Loc --      Pain Edu? --      Excl. in GC? --    No data found.  Updated Vital Signs BP 122/67 (BP Location: Left Arm)   Pulse (!) 59   Temp 97.9 F (36.6 C) (Oral)   Resp 19   SpO2 100%   Visual Acuity Right Eye Distance:   Left Eye Distance:   Bilateral Distance:    Right Eye Near:   Left Eye Near:     Bilateral Near:     Physical Exam Vitals signs and nursing note reviewed.  Constitutional:      Appearance: Normal appearance.  HENT:     Head: Normocephalic and atraumatic.     Nose: Nose normal.  Eyes:     Conjunctiva/sclera: Conjunctivae normal.  Neck:     Musculoskeletal: Normal range of motion.  Pulmonary:     Effort: Pulmonary effort is normal.  Abdominal:     Palpations: Abdomen is soft.     Tenderness: There is no abdominal tenderness.  Musculoskeletal: Normal range of motion.        General: Swelling and tenderness present.     Comments: Mild swelling, tenderness and bruising to the right 4th and 5th metacarpal at the head.  Able to wiggle fingers Radial pulse strong.  Good cap refill.   Skin:    General: Skin is warm and dry.  Neurological:     Mental Status: He is alert.  Psychiatric:        Mood and Affect: Mood normal.      UC Treatments / Results  Labs (all labs ordered are listed, but only abnormal results are displayed) Labs Reviewed - No data to display  EKG None  Radiology Dg Hand Complete Right  Result Date: 08/01/2018 CLINICAL DATA:  The patient suffered a right hand injury last night when he fell and tried to catch himself. Initial encounter. EXAM: RIGHT HAND - COMPLETE 3+ VIEW COMPARISON:  None. FINDINGS: There is no evidence of fracture or dislocation. There is no evidence of arthropathy or other focal bone abnormality. Soft tissues are unremarkable. IMPRESSION: Negative exam. Electronically Signed   By: Inge Rise M.D.   On: 08/01/2018 09:54    Procedures Procedures (including critical care time)  Medications Ordered in UC Medications - No data to display  Initial Impression / Assessment and Plan / UC Course  I have reviewed the triage vital signs and the nursing notes.  Pertinent labs & imaging results that were available during my care of the patient were reviewed by me and considered in my medical decision making (see chart  for details).     X-ray negative for any acute fractures Most likely bad bruise causing symptoms. We will have him ice the area and take ibuprofen as needed for pain Follow up as needed for continued or worsening symptoms  Final Clinical Impressions(s) / UC Diagnoses   Final diagnoses:  Injury of right hand, initial encounter     Discharge Instructions     Your x ray did not show any fractures.  You can take ibuprofen for the pain Ice, and rest the hand.     ED Prescriptions    None     Controlled Substance Prescriptions [place] Controlled Substance Registry consulted? Not Applicable   Orvan July, NP 08/01/18 1514

## 2018-08-01 NOTE — Discharge Instructions (Addendum)
Your x ray did not show any fractures.  You can take ibuprofen for the pain Ice, and rest the hand.

## 2023-03-17 ENCOUNTER — Ambulatory Visit
Admission: EM | Admit: 2023-03-17 | Discharge: 2023-03-17 | Disposition: A | Discharge status: Home / Self Care | Payer: Self-pay | Attending: Family Medicine | Admitting: Family Medicine

## 2023-03-17 DIAGNOSIS — K921 Melena: Secondary | ICD-10-CM

## 2023-03-17 NOTE — ED Triage Notes (Signed)
Pt presents to UC for c/o rectal bleeding since yesterday. Pt states one occurrence happened a few months ago. Denies rectal or abd pain.

## 2023-03-17 NOTE — Discharge Instructions (Addendum)
Please follow-up with your EP ASAP for possible referral for colonoscopy for further workup of your bleeding.  Please go to the ER if you develop any worsening symptoms before you see your PCP.  I hope you feel better soon!

## 2023-03-17 NOTE — ED Provider Notes (Signed)
[facility]    CSN: [id number] Arrival date & time: 03/17/23  0840      History   Chief Complaint No chief complaint on file.   HPI Jose Mullins is a 33 y.o. male presents for hematochezia.  Patient reports over the year he has had intermittent hematochezia with more frequent episodes occurring the past couple of days.  He describes bright red blood with wiping as well as in the stool/toilet.  Denies any dark tarry stools.  Denies any pain with defecation, constipation/straining, abdominal pain or distention, nausea/vomiting, hematuria, fevers, appetite change, or weight loss.  States he is more hungry than normal.  Does report a strong family history of breast cancer but no known colon cancer.  He does not have a PCP.  Denies any change in medications or diet.  No other concerns at this time.  HPI  History reviewed. No pertinent past medical history.  There are no active problems to display for this patient.   Past Surgical History:  Procedure Laterality Date   ARTHROSCOPIC REPAIR ACL         Home Medications    Prior to Admission medications   Not on File    Family History Family History  Problem Relation Age of Onset   Cancer Mother    HIV/AIDS Mother    Healthy Father     Social History Social History   Tobacco Use   Smoking status: Light Smoker   Smokeless tobacco: Never  Vaping Use   Vaping status: Every Day  Substance Use Topics   Alcohol use: Not Currently   Drug use: Yes    Types: Marijuana     Allergies   Shellfish allergy   Review of Systems Review of Systems  Gastrointestinal:  Positive for blood in stool.     Physical Exam Triage Vital Signs ED Triage Vitals  Encounter Vitals Group     BP 03/17/23 1001 105/65     Systolic BP Percentile --      Diastolic BP Percentile --      Pulse Rate 03/17/23 1001 (!) 50     Resp 03/17/23 1001 16     Temp 03/17/23 1001 98 F (36.7 C)     Temp Source 03/17/23 1001 Oral      SpO2 03/17/23 1001 98 %     Weight --      Height --      Head Circumference --      Peak Flow --      Pain Score 03/17/23 0957 0     Pain Loc --      Pain Education --      Exclude from Growth Chart --    No data found.  Updated Vital Signs BP 105/65 (BP Location: Right Arm)   Pulse (!) 50   Temp 98 F (36.7 C) (Oral)   Resp 16   SpO2 98%   Visual Acuity Right Eye Distance:   Left Eye Distance:   Bilateral Distance:    Right Eye Near:   Left Eye Near:    Bilateral Near:     Physical Exam Vitals and nursing note reviewed.  Constitutional:      General: He is not in acute distress.    Appearance: Normal appearance. He is obese. He is not ill-appearing.  HENT:     Head: Normocephalic and atraumatic.  Eyes:     Pupils: Pupils are equal, round, and reactive to light.  Cardiovascular:  Rate and Rhythm: Normal rate.  Pulmonary:     Effort: Pulmonary effort is normal.  Abdominal:     General: Abdomen is flat. Bowel sounds are normal.     Palpations: There is no hepatomegaly or splenomegaly.     Tenderness: There is no abdominal tenderness.  Skin:    General: Skin is warm and dry.  Neurological:     General: No focal deficit present.     Mental Status: He is alert and oriented to person, place, and time.  Psychiatric:        Mood and Affect: Mood normal.        Behavior: Behavior normal.      UC Treatments / Results  Labs (all labs ordered are listed, but only abnormal results are displayed) Labs Reviewed - No data to display  EKG   Radiology No results found.  Procedures Procedures (including critical care time)  Medications Ordered in UC Medications - No data to display  Initial Impression / Assessment and Plan / UC Course  I have reviewed the triage vital signs and the nursing notes.  Pertinent labs & imaging results that were available during my care of the patient were reviewed by me and considered in my medical decision making (see chart  for details).     Reviewed exam and symptoms with patient.  Discussed limitations and abilities of urgent care.  Patient with intermittent hematochezia x 1 year.  Advised that he will need PCP and likely colonoscopy for further workup of this to rule out any malignant causes.  Nursing staff was able to set patient with a PCP appointment where he can obtain the referral for the procedure.  He was instructed to go to the ER ASAP for any worsening symptoms that occur prior to him seeing his PCP, red flags reviewed and patient verbalized understanding. Final Clinical Impressions(s) / UC Diagnoses   Final diagnoses:  Hematochezia     Discharge Instructions      Please follow-up with your EP ASAP for possible referral for colonoscopy for further workup of your bleeding.  Please go to the ER if you develop any worsening symptoms before you see your PCP.  I hope you feel better soon!    ED Prescriptions   None    PDMP not reviewed this encounter.   Radford Pax, NP 03/17/23 1017

## 2023-04-12 ENCOUNTER — Ambulatory Visit (INDEPENDENT_AMBULATORY_CARE_PROVIDER_SITE_OTHER): Payer: Self-pay | Admitting: Family Medicine

## 2023-04-12 ENCOUNTER — Encounter: Payer: Self-pay | Admitting: Family Medicine

## 2023-04-12 VITALS — BP 128/76 | HR 70 | Temp 97.9°F | Ht 73.0 in | Wt 157.8 lb

## 2023-04-12 DIAGNOSIS — K5904 Chronic idiopathic constipation: Secondary | ICD-10-CM | POA: Insufficient documentation

## 2023-04-12 DIAGNOSIS — K640 First degree hemorrhoids: Secondary | ICD-10-CM | POA: Insufficient documentation

## 2023-04-12 DIAGNOSIS — Z803 Family history of malignant neoplasm of breast: Secondary | ICD-10-CM | POA: Insufficient documentation

## 2023-04-12 DIAGNOSIS — K921 Melena: Secondary | ICD-10-CM | POA: Insufficient documentation

## 2023-04-12 LAB — CBC WITH DIFFERENTIAL/PLATELET
Basophils Absolute: 0 10*3/uL (ref 0.0–0.1)
Basophils Relative: 0.6 % (ref 0.0–3.0)
Eosinophils Absolute: 0 10*3/uL (ref 0.0–0.7)
Eosinophils Relative: 0.4 % (ref 0.0–5.0)
HCT: 45.5 % (ref 39.0–52.0)
Hemoglobin: 15.1 g/dL (ref 13.0–17.0)
Lymphocytes Relative: 32.8 % (ref 12.0–46.0)
Lymphs Abs: 1.5 10*3/uL (ref 0.7–4.0)
MCHC: 33.2 g/dL (ref 30.0–36.0)
MCV: 92.8 fL (ref 78.0–100.0)
Monocytes Absolute: 0.3 10*3/uL (ref 0.1–1.0)
Monocytes Relative: 7.4 % (ref 3.0–12.0)
Neutro Abs: 2.7 10*3/uL (ref 1.4–7.7)
Neutrophils Relative %: 58.8 % (ref 43.0–77.0)
Platelets: 172 10*3/uL (ref 150.0–400.0)
RBC: 4.9 Mil/uL (ref 4.22–5.81)
RDW: 13.3 % (ref 11.5–15.5)
WBC: 4.6 10*3/uL (ref 4.0–10.5)

## 2023-04-12 LAB — TSH: TSH: 0.61 u[IU]/mL (ref 0.35–5.50)

## 2023-04-12 MED ORDER — POLYETHYLENE GLYCOL 3350 17 GM/SCOOP PO POWD: 17.0000 g | Freq: Every day | ORAL | 0 refills | Status: AC

## 2023-04-12 NOTE — Progress Notes (Signed)
 Assessment/Plan:   Assessment and Plan    Hematochezia Intermittent rectal bleeding for over a year, now occurring every other bowel movement. Blood is present both on wiping and in the bowl. Associated with pelvic pain and discomfort during bowel movements. No associated fever, chills, rash, weight loss, nausea, or vomiting. Differential diagnosis includes hemorrhoids, colorectal cancer, inflammatory bowel disease, and ulcers. Family history of breast cancer in mother and sister, and unspecified cancer in uncle. No signs of anemia or other systemic symptoms. Reports fatigue and shortness of breath post-defecation. No stool incontinence. Has tried dietary modifications with some relief. No prior use of stool softeners. Discussed the need for a colonoscopy to rule out serious conditions such as colorectal cancer and inflammatory bowel disease. Explained that constipation is a common cause of rectal bleeding and that stool softeners can help reduce straining and bleeding. Informed about the low risk and high benefit of using MiraLAX to achieve soft stool consistency. - Refer to gastroenterology for colonoscopy - Order CBC, CMP, and TSH - Recommend 17 grams of MiraLAX in 8 ounces of preferred beverage daily - Advise to increase intake of fruits, vegetables, and water - Discuss work accommodations and provide necessary documentation upon receipt of form  Constipation Contributing to rectal bleeding and pelvic pain. No clear understanding of what constitutes constipation. Potential for hemorrhoids due to chronic straining. Discussed the importance of achieving soft stool consistency to reduce straining and potential hemorrhoid formation. Explained that internal hemorrhoids can bleed without pain and that external hemorrhoids can be painful and itchy. - Recommend 17 grams of MiraLAX in 8 ounces of preferred beverage daily - Advise to increase intake of fruits, vegetables, and water - Discuss the  importance of soft stool consistency to reduce straining and potential hemorrhoid formation  General Health Maintenance Discussed the importance of dietary fiber and hydration in maintaining bowel health. Encouraged the use of probiotics such as yogurt or kombucha. - Advise to increase intake of fruits, vegetables, and water - Recommend probiotics such as yogurt or kombucha  Follow-up - Follow up with gastroenterology after colonoscopy - Review baseline lab results once available.        There are no discontinued medications.  Return if symptoms worsen or fail to improve, for rectal bleeding, constipation.    Subjective:   Encounter date: 04/12/2023  Jose Mullins is a 33 y.o. male who has Family history of breast cancer in sister; Grade I hemorrhoids; Chronic idiopathic constipation; and Hematochezia on their problem list..   He  has a past medical history of Allergy.Marland Kitchen   He presents with chief complaint of Establish Care (Patient has paper work for light duty at work due to having trouble with having bowel movements at work. He will bring in the forms for completion. ) .   Discussed the use of AI scribe software for clinical note transcription with the patient, who gave verbal consent to proceed.  History of Present Illness   Jose Mullins is a 33 year old male who presents with concerns about frequent bowel movements and blood in the stool.  He has experienced frequent bowel movements with hematochezia for about a year, initially attributing it to straining during constipation. Currently, he notices blood in the stool every other time he defecates, with blood appearing both on the tissue and in the bowl. His stool is not thick and dark, and he experiences pelvic pain associated with bowel movements.  He denies fever, chills, rash, weight loss, nausea, vomiting, and  stool incontinence. He reports fatigue and tiredness after bowel movements but is unsure about shortness  of breath. He has attempted dietary changes, including increased water intake, which sometimes helps alleviate symptoms. He has not used any stool softeners or laxatives but is open to trying them. He previously had a hemorrhoid, which he believes was unrelated to his current symptoms.  He works in a physically demanding, productivity-based job, which is impacted by his symptoms. 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  He has a family history of cancer, with his sister and mother having had breast cancer, and an uncle currently undergoing cancer treatment.      Past Surgical History:  Procedure Laterality Date   ARTHROSCOPIC REPAIR ACL      No outpatient medications prior to visit.   No facility-administered medications prior to visit.    Family History  Problem Relation Age of Onset   Cancer Mother    HIV/AIDS Mother    Healthy Father    Cancer Sister     Social History   Socioeconomic History   Marital status: Single    Spouse name: Not on file   Number of children: Not on file   Years of education: Not on file   Highest education level: GED or equivalent  Occupational History   Not on file  Tobacco Use   Smoking status: Former    Current packs/day: 0.00    Types: Cigarettes    Quit date: 08/28/2021    Years since quitting: 1.6   Smokeless tobacco: Never  Vaping Use   Vaping status: Every Day  Substance and Sexual Activity   Alcohol use: Not Currently   Drug use: Not Currently    Types: Marijuana   Sexual activity: Not Currently    Birth control/protection: Abstinence  Other Topics Concern   Not on file  Social History Narrative   Not on file   Social Drivers of Health   Financial Resource Strain: Low Risk  (04/08/2023)   Overall Financial Resource Strain  (CARDIA)    Difficulty of Paying Living Expenses: Not very hard  Food Insecurity: No Food Insecurity (04/08/2023)   Hunger Vital Sign    Worried About Running Out of Food in the Last Year: Never true    Ran Out of Food in the Last Year: Never true  Transportation Needs: Unmet Transportation Needs (04/08/2023)   PRAPARE - Transportation    Lack of Transportation (Medical): No    Lack of Transportation (Non-Medical): Yes  Physical Activity: Not on file  Stress: Stress Concern Present (04/08/2023)   [employer] of Occupational Health - Occupational Stress Questionnaire    Feeling of Stress : To some extent  Social Connections: Socially Isolated (04/08/2023)   Social Connection and Isolation Panel [NHANES]    Frequency of Communication with Friends and Family: More than three times a week    Frequency of Social Gatherings with Friends and Family: Three times a week    Attends Religious Services: Never    Active Member of Clubs or Organizations: No    Attends Banker Meetings: Not on file    Marital Status: Never married  Intimate Partner Violence: Not on file  Objective:  Physical Exam: BP 128/76   Pulse 70   Temp 97.9 F (36.6 C) (Temporal)   Ht 6\' 1"  (1.854 m)   Wt 157 lb 12.8 oz (71.6 kg)   SpO2 98%   BMI 20.82 kg/m    Wt Readings from Last 3 Encounters:  04/12/23 157 lb 12.8 oz (71.6 kg)  01/11/16 170 lb (77.1 kg)  01/13/14 165 lb (74.8 kg)     Physical Exam Constitutional:      Appearance: Normal appearance.  HENT:     Head: Normocephalic and atraumatic.     Right Ear: Hearing normal.     Left Ear: Hearing normal.     Nose: Nose normal.  Eyes:     General: No scleral icterus.       Right eye: No discharge.        Left eye: No discharge.     Extraocular Movements: Extraocular movements intact.  Cardiovascular:     Rate and Rhythm: Normal rate and  regular rhythm.     Heart sounds: Normal heart sounds.  Pulmonary:     Effort: Pulmonary effort is normal.     Breath sounds: Normal breath sounds.  Abdominal:     Palpations: Abdomen is soft.     Tenderness: There is no abdominal tenderness.  Genitourinary:    Comments: Rectal exam deferred per patient request. Skin:    General: Skin is warm.     Findings: No rash.  Neurological:     General: No focal deficit present.     Mental Status: He is alert.     Cranial Nerves: No cranial nerve deficit.  Psychiatric:        Mood and Affect: Mood normal.        Behavior: Behavior normal.        Thought Content: Thought content normal.        Judgment: Judgment normal.     No results found.  No results found for this or any previous visit (from the past 2160 hours).      Garner Nash, MD, MS

## 2023-04-12 NOTE — Patient Instructions (Addendum)
 VISIT SUMMARY:  Jose Mullins, a 33 year old male, visited today due to concerns about frequent bowel movements and blood in his stool. He has been experiencing these symptoms for about a year, with associated pelvic pain but no other systemic symptoms. He has tried dietary changes with some relief but has not used any stool softeners. His symptoms are impacting his physically demanding job. He has a family history of cancer.  YOUR PLAN:  -HEMATOCHEZIA: Hematochezia means the presence of blood in the stool. This can be caused by various conditions, including hemorrhoids, colorectal cancer, inflammatory bowel disease, and ulcers. To rule out serious conditions, a colonoscopy is recommended. You should take 17 grams of MiraLAX in 8 ounces of your preferred beverage daily to help achieve soft stool consistency and reduce straining. Additionally, increase your intake of fruits, vegetables, and water. We will also order blood tests (CBC, CMP, and TSH) to check for any underlying issues. Please discuss any necessary work accommodations, and we can provide documentation if needed.  INSTRUCTIONS:  Please follow up with gastroenterology after your colonoscopy. We will review your baseline lab results once they are available.  Go Emergency Department if severe bleeding occurs

## 2023-04-13 ENCOUNTER — Encounter: Payer: Self-pay | Admitting: Family Medicine

## 2023-04-13 LAB — COMPREHENSIVE METABOLIC PANEL
ALT: 6 U/L (ref 0–53)
AST: 11 U/L (ref 0–37)
Albumin: 4.5 g/dL (ref 3.5–5.2)
Alkaline Phosphatase: 42 U/L (ref 39–117)
BUN: 13 mg/dL (ref 6–23)
CO2: 27 meq/L (ref 19–32)
Calcium: 9.2 mg/dL (ref 8.4–10.5)
Chloride: 104 meq/L (ref 96–112)
Creatinine, Ser: 1.19 mg/dL (ref 0.40–1.50)
GFR: 79.89 mL/min (ref >60.00)
Glucose, Bld: 86 mg/dL (ref 70–99)
Potassium: 4.2 meq/L (ref 3.5–5.1)
Sodium: 142 meq/L (ref 135–145)
Total Bilirubin: 0.4 mg/dL (ref 0.2–1.2)
Total Protein: 7.1 g/dL (ref 6.0–8.3)

## 2023-04-14 ENCOUNTER — Telehealth: Payer: Self-pay | Admitting: Family Medicine

## 2023-04-14 NOTE — Telephone Encounter (Signed)
 Patient dropped off document  accommodation forms , to be filled out by provider. Patient requested to send it back via Fax within ASAP. Document is located in providers tray at front office.Please advise at Mobile [phone]

## 2023-04-15 DIAGNOSIS — Z0279 Encounter for issue of other medical certificate: Secondary | ICD-10-CM

## 2023-04-16 ENCOUNTER — Telehealth: Payer: Self-pay | Admitting: Family Medicine

## 2023-04-16 NOTE — Telephone Encounter (Signed)
 Refaxed accommodation forms to HR dept this morning successfully.

## 2023-05-07 ENCOUNTER — Encounter: Payer: Self-pay | Admitting: Gastroenterology

## 2023-06-30 ENCOUNTER — Ambulatory Visit: Payer: Self-pay | Admitting: Gastroenterology

## 2023-06-30 NOTE — Progress Notes (Unsigned)
  Gastroenterology Initial Consultation   Referring Provider Catheryn Cluck, MD [address] [place],  [place] [address]  Primary Care Provider Catheryn Cluck, MD  Patient Profile: Jose Mullins is a 33 y.o. male who is seen in consultation in the [facility] at the request of Dr. Hildy Lowers for evaluation and management of the problem(s) noted below.  Problem List: Hematochezia Constipation   History of Present Illness   Jose Mullins is a 33 y.o. male without any significant past medical history who is referred to the office for evaluation and management of hematochezia and constipation.  -- Reports a longstanding history of constipation and hematochezia for a period of years -- Stools daily or at minimum every other day -- Describes symptoms of straining to defecate and obstructive defecation -- Strains to go to the bathroom and will sit on the toilet for 10 to 45 minutes in order to have a bowel movement -- Bowel movements are generally described to being normal in size-no thin caliber stools -- Is now taking stool softeners and this has improved the consistency of his stools  -- Reports seeing bright red blood with wiping and streaking around bowel movements at least 50% of the time -- Endorses anorectal discomfort with defecation and despite using stool softeners -- No abdominal pain or cramping -- Feels rectal pressure after defecation  -- Reports a longstanding history of night sweats dating back to childhood -- No fluctuations in weight -has always been thin  -- Denies any known family history of colorectal cancer or polyps -- No prior colonoscopy  -Labs performed by PCP 03/2023 - normal CBC, CMP, TSH   Last colonoscopy: None Last endoscopy: None  Last Abd CT/CTE/MRE:  None  GI Review of Symptoms Significant for obstipation, rectal bleeding, hematochezia, anorectal discomfort. Otherwise negative.  General Review of Systems   Review of systems is significant for the pertinent positives and negatives as listed per the HPI.  Full ROS is otherwise negative.  Past Medical History   Past Medical History:  Diagnosis Date   Allergy    Shellfish     Past Surgical History   Past Surgical History:  Procedure Laterality Date   ARTHROSCOPIC REPAIR ACL       Allergies and Medications   Allergies  Allergen Reactions   Shellfish Allergy Swelling   Current Meds  Medication Sig   Na Sulfate-K Sulfate-Mg Sulfate concentrate (SUPREP) 17.5-3.13-1.6 GM/177ML SOLN Take 1 kit (354 mLs total) by mouth once for 1 dose.   polyethylene glycol powder (GLYCOLAX /MIRALAX ) 17 GM/SCOOP powder Take 17 g by mouth daily.     Family History   Family History  Problem Relation Age of Onset   Cancer Mother    HIV/AIDS Mother    Healthy Father    Cancer Sister      Social History   Social History   Tobacco Use   Smoking status: Former    Current packs/day: 0.00    Types: Cigarettes    Start date: 2021    Quit date: 08/28/2021    Years since quitting: 1.8   Smokeless tobacco: Never  Vaping Use   Vaping status: Every Day  Substance Use Topics   Alcohol use: Not Currently   Drug use: Not Currently    Types: Marijuana   Jose Mullins reports that he quit smoking about 22 months ago. His smoking use included cigarettes. He started smoking about 4 years ago. He has never used smokeless tobacco. He reports that he  does not currently use alcohol. He reports that he does not currently use drugs after having used the following drugs: Marijuana.  Vital Signs and Physical Examination   Vitals:   07/01/23 1041  BP: 90/60  Pulse: 77   Body mass index is 20.16 kg/m. Weight: 157 lb (71.2 kg)  General: Well developed, well nourished, no acute distress Head: Normocephalic and atraumatic Eyes: Sclerae anicteric, EOMI Lungs: Clear throughout to auscultation Heart: Regular rate and rhythm; No murmurs, rubs or  bruits Abdomen: Soft, non tender and non distended. No masses, hepatosplenomegaly or hernias noted. Normal Bowel sounds Rectal:  Deferred Musculoskeletal: Symmetrical with no gross deformities   Review of Data  The following data was reviewed at the time of this encounter:  Laboratory Studies      Latest Ref Rng & Units 04/12/2023    1:57 PM 01/14/2014    2:19 AM  CBC  WBC 4.0 - 10.5 K/uL 4.6  7.3   Hemoglobin 13.0 - 17.0 g/dL 16.1  09.6   Hematocrit 39.0 - 52.0 % 45.5  42.6   Platelets 150.0 - 400.0 K/uL 172.0  156     No results found for: "LIPASE"    Latest Ref Rng & Units 04/12/2023    1:57 PM 01/14/2014    2:19 AM  CMP  Glucose 70 - 99 mg/dL 86  96   BUN 6 - 23 mg/dL 13  16   Creatinine 0.45 - 1.50 mg/dL 4.09  8.11   Sodium 914 - 145 mEq/L 142  141   Potassium 3.5 - 5.1 mEq/L 4.2  3.9   Chloride 96 - 112 mEq/L 104  101   CO2 19 - 32 mEq/L 27  26   Calcium 8.4 - 10.5 mg/dL 9.2  9.4   Total Protein 6.0 - 8.3 g/dL 7.1    Total Bilirubin 0.2 - 1.2 mg/dL 0.4    Alkaline Phos 39 - 117 U/L 42    AST 0 - 37 U/L 11    ALT 0 - 53 U/L 6     Lab Results  Component Value Date   TSH 0.61 04/12/2023    Imaging Studies  None  GI Procedures and Studies  None   Clinical Impression  It is my clinical impression that Jose Mullins is a 33 y.o. male with;  Hematochezia Constipation  Jose Mullins presents to the office today for evaluation and management of chronic constipation, hematochezia and rectal bleeding.  He reports experiencing symptoms of constipation that sound most consistent with obstructive defecation for a period of years.  Over time he has developed rectal bleeding that occurs with at least 50% of bowel movements.  Blood is described as being seen on the tissue paper with wiping as well as mixed in the toilet bowl with stool.  Endorses anorectal pain and discomfort but no abdominal pain.  Recent labs were reassuring.  We discussed that the differential diagnosis for  his symptoms could include hemorrhoids, colon polyps, proctitis, AVMs, malignancy.  Reviewed that it would be appropriate to proceed with colonoscopy for further investigation of his symptoms.  I discussed the risks of anesthesia, aspiration, perforation and bleeding and he was amenable to proceeding with colonoscopy.  Plan  Schedule EGD and colonoscopy at [facility] -will utilize 1 day bowel prep as there does not appear to be a history of slow transit constipation  Planned Follow Up 2-3  months  The patient or caregiver verbalized understanding of the material covered, with no barriers to understanding.  All questions were answered. Patient or caregiver is agreeable with the plan outlined above.    It was a pleasure to see Jose Mullins.  If you have any questions or concerns regarding this evaluation, do not hesitate to contact me.  Eugenia Hess, MD [facility]

## 2023-06-30 NOTE — Progress Notes (Deleted)
 HPI :    Past Medical History:  Diagnosis Date   Allergy    Shellfish     Past Surgical History:  Procedure Laterality Date   ARTHROSCOPIC REPAIR ACL     Family History  Problem Relation Age of Onset   Cancer Mother    HIV/AIDS Mother    Healthy Father    Cancer Sister    Social History   Tobacco Use   Smoking status: Former    Current packs/day: 0.00    Types: Cigarettes    Quit date: 08/28/2021    Years since quitting: 1.8   Smokeless tobacco: Never  Vaping Use   Vaping status: Every Day  Substance Use Topics   Alcohol use: Not Currently   Drug use: Not Currently    Types: Marijuana   Current Outpatient Medications  Medication Sig Dispense Refill   polyethylene glycol powder (GLYCOLAX /MIRALAX ) 17 GM/SCOOP powder Take 17 g by mouth daily. 500 g 0   No current facility-administered medications for this visit.   Allergies  Allergen Reactions   Shellfish Allergy Swelling     Review of Systems: All systems reviewed and negative except where noted in HPI.    No results found.  Physical Exam: There were no vitals taken for this visit. Constitutional: Pleasant,well-developed, ***male in no acute distress. HEENT: Normocephalic and atraumatic. Conjunctivae are normal. No scleral icterus. Neck supple.  Cardiovascular: Normal rate, regular rhythm.  Pulmonary/chest: Effort normal and breath sounds normal. No wheezing, rales or rhonchi. Abdominal: Soft, nondistended, nontender. Bowel sounds active throughout. There are no masses palpable. No hepatomegaly. Extremities: no edema Lymphadenopathy: No cervical adenopathy noted. Neurological: Alert and oriented to person place and time. Skin: Skin is warm and dry. No rashes noted. Psychiatric: Normal mood and affect. Behavior is normal.  CBC    Component Value Date/Time   WBC 4.6 04/12/2023 1357   RBC 4.90 04/12/2023 1357   HGB 15.1 04/12/2023 1357   HCT 45.5 04/12/2023 1357   PLT 172.0 04/12/2023 1357    MCV 92.8 04/12/2023 1357   MCH 31.5 01/14/2014 0219   MCHC 33.2 04/12/2023 1357   RDW 13.3 04/12/2023 1357   LYMPHSABS 1.5 04/12/2023 1357   MONOABS 0.3 04/12/2023 1357   EOSABS 0.0 04/12/2023 1357   BASOSABS 0.0 04/12/2023 1357    CMP     Component Value Date/Time   NA 142 04/12/2023 1357   K 4.2 04/12/2023 1357   CL 104 04/12/2023 1357   CO2 27 04/12/2023 1357   GLUCOSE 86 04/12/2023 1357   BUN 13 04/12/2023 1357   CREATININE 1.19 04/12/2023 1357   CALCIUM 9.2 04/12/2023 1357   PROT 7.1 04/12/2023 1357   ALBUMIN 4.5 04/12/2023 1357   AST 11 04/12/2023 1357   ALT 6 04/12/2023 1357   ALKPHOS 42 04/12/2023 1357   BILITOT 0.4 04/12/2023 1357   GFRNONAA 89 (L) 01/14/2014 0219   GFRAA >90 01/14/2014 0219       Latest Ref Rng & Units 04/12/2023    1:57 PM 01/14/2014    2:19 AM  CBC EXTENDED  WBC 4.0 - 10.5 K/uL 4.6  7.3   RBC 4.22 - 5.81 Mil/uL 4.90  4.76   Hemoglobin 13.0 - 17.0 g/dL 16.1  09.6   HCT 04.5 - 52.0 % 45.5  42.6   Platelets 150.0 - 400.0 K/uL 172.0  156   NEUT# 1.4 - 7.7 K/uL 2.7  4.3   Lymph# 0.7 - 4.0 K/uL 1.5  2.0  ASSESSMENT AND PLAN:  Catheryn Cluck, MD

## 2023-07-01 ENCOUNTER — Encounter: Payer: Self-pay | Admitting: Pediatrics

## 2023-07-01 ENCOUNTER — Ambulatory Visit (INDEPENDENT_AMBULATORY_CARE_PROVIDER_SITE_OTHER): Payer: Self-pay | Admitting: Pediatrics

## 2023-07-01 VITALS — BP 90/60 | HR 77 | Ht 74.0 in | Wt 157.0 lb

## 2023-07-01 DIAGNOSIS — K5909 Other constipation: Secondary | ICD-10-CM

## 2023-07-01 DIAGNOSIS — K5900 Constipation, unspecified: Secondary | ICD-10-CM

## 2023-07-01 DIAGNOSIS — K921 Melena: Secondary | ICD-10-CM

## 2023-07-01 MED ORDER — NA SULFATE-K SULFATE-MG SULF 17.5-3.13-1.6 GM/177ML PO SOLN: 1.0000 | Freq: Once | ORAL | 0 refills | Status: AC

## 2023-07-01 NOTE — Patient Instructions (Signed)
 You have been scheduled for a colonoscopy. Please follow written instructions given to you at your visit today.   If you use inhalers (even only as needed), please bring them with you on the day of your procedure.  DO NOT TAKE 7 DAYS PRIOR TO TEST- Trulicity (dulaglutide) Ozempic, Wegovy (semaglutide) Mounjaro (tirzepatide) Bydureon Bcise (exanatide extended release)  DO NOT TAKE 1 DAY PRIOR TO YOUR TEST Rybelsus (semaglutide) Adlyxin (lixisenatide) Victoza (liraglutide) Byetta (exanatide) ___________________________________________________________________________  Thank you for entrusting me with your care and for choosing [facility],  Dr. Eugenia Hess  _______________________________________________________  If your blood pressure at your visit was 140/90 or greater, please contact your primary care physician to follow up on this.  _______________________________________________________  If you are age 62 or older, your body mass index should be between 23-30. Your Body mass index is 20.16 kg/m. If this is out of the aforementioned range listed, please consider follow up with your Primary Care Provider.  If you are age 50 or younger, your body mass index should be between 19-25. Your Body mass index is 20.16 kg/m. If this is out of the aformentioned range listed, please consider follow up with your Primary Care Provider.   ________________________________________________________  The [facility] providers would like to encourage you to use MYCHART to communicate with providers for non-urgent requests or questions.  Due to long hold times on the telephone, sending your provider a message by [facility] may be a faster and more efficient way to get a response.  Please allow 48 business hours for a response.  Please remember that this is for non-urgent requests.  _______________________________________________________

## 2023-07-28 ENCOUNTER — Encounter: Payer: Self-pay | Admitting: Pediatrics

## 2023-08-09 NOTE — Progress Notes (Deleted)
 [facility] History and Physical   Primary Care Physician:  Sebastian Beverley NOVAK, MD   Reason for Procedure:  Hematochezia, constipation  Plan:    Colonoscopy     HPI: Jose Mullins is a 33 y.o. male undergoing colonoscopy for investigation of hematochezia and constipation.  Patient reports a longstanding history of constipation and hematochezia for many years.  Endorses straining with defecation.  Sees bright red blood with wiping and streaking around bowel movements at least 50% of the time.  Endorses anorectal discomfort with defecation despite using stool softeners.  No family history of colorectal cancer or polyps.  Labs performed by PCP February 2025 showed normal CBC, CMP, TSH.   Past Medical History:  Diagnosis Date   Allergy    Shellfish    Past Surgical History:  Procedure Laterality Date   ARTHROSCOPIC REPAIR ACL      Prior to Admission medications   Medication Sig Start Date End Date Taking? Authorizing Provider  polyethylene glycol powder (GLYCOLAX /MIRALAX ) 17 GM/SCOOP powder Take 17 g by mouth daily. 04/12/23   Sebastian Beverley NOVAK, MD    Current Outpatient Medications  Medication Sig Dispense Refill   polyethylene glycol powder (GLYCOLAX /MIRALAX ) 17 GM/SCOOP powder Take 17 g by mouth daily. 500 g 0   No current facility-administered medications for this visit.    Allergies as of 08/11/2023 - Review Complete 07/01/2023  Allergen Reaction Noted   Shellfish allergy Swelling 01/13/2014    Family History  Problem Relation Age of Onset   Cancer Mother    HIV/AIDS Mother    Healthy Father    Cancer Sister     Social History   Socioeconomic History   Marital status: Single    Spouse name: Not on file   Number of children: Not on file   Years of education: Not on file   Highest education level: GED or equivalent  Occupational History   Not on file  Tobacco Use   Smoking status: Former    Current packs/day: 0.00    Types: Cigarettes     Start date: 2021    Quit date: 08/28/2021    Years since quitting: 1.9   Smokeless tobacco: Never  Vaping Use   Vaping status: Every Day  Substance and Sexual Activity   Alcohol use: Not Currently   Drug use: Not Currently    Types: Marijuana   Sexual activity: Not Currently    Birth control/protection: Abstinence  Other Topics Concern   Not on file  Social History Narrative   Not on file   Social Drivers of Health   Financial Resource Strain: Low Risk  (04/08/2023)   Overall Financial Resource Strain (CARDIA)    Difficulty of Paying Living Expenses: Not very hard  Food Insecurity: No Food Insecurity (04/08/2023)   Hunger Vital Sign    Worried About Running Out of Food in the Last Year: Never true    Ran Out of Food in the Last Year: Never true  Transportation Needs: Unmet Transportation Needs (04/08/2023)   PRAPARE - Transportation    Lack of Transportation (Medical): No    Lack of Transportation (Non-Medical): Yes  Physical Activity: Not on file  Stress: Stress Concern Present (04/08/2023)   [employer] of Occupational Health - Occupational Stress Questionnaire    Feeling of Stress : To some extent  Social Connections: Socially Isolated (04/08/2023)   Social Connection and Isolation Panel    Frequency of Communication with Friends and Family: More than three times a week  Frequency of Social Gatherings with Friends and Family: Three times a week    Attends Religious Services: Never    Active Member of Clubs or Organizations: No    Attends Engineer, structural: Not on file    Marital Status: Never married  Intimate Partner Violence: Not on file    Review of Systems:  All other review of systems negative except as mentioned in the HPI.  Physical Exam: Vital signs There were no vitals taken for this visit.  General:   Alert,  Well-developed, well-nourished, pleasant and cooperative in NAD Airway:  Mallampati  Lungs:  Clear throughout to auscultation.    Heart:  Regular rate and rhythm; no murmurs, clicks, rubs,  or gallops. Abdomen:  Soft, nontender and nondistended. Normal bowel sounds.   Neuro/Psych:  Normal mood and affect. A and O x 3  Jose Hausen, MD [facility]

## 2023-08-11 ENCOUNTER — Encounter: Payer: Self-pay | Admitting: Pediatrics

## 2023-08-11 ENCOUNTER — Telehealth: Payer: Self-pay | Admitting: Pediatrics

## 2023-08-11 NOTE — Telephone Encounter (Signed)
 Good afternoon Dr. Suzann, I received a call from this patient stating that last minute his care partner had to cancel on him and he will not be able to come in today for his colonoscopy procedure at 2:30 PM. Patient was rescheduled for August the 6 th at 10 AM. Please advise.

## 2023-09-15 ENCOUNTER — Telehealth: Payer: Self-pay

## 2023-09-15 NOTE — Telephone Encounter (Signed)
 Multiple phone calls attempted to complete pre-visit appointment, all unsuccessful.  Busy signal obtained each time.  If patient does not call back to reschedule pre-visit appointment before the end of the day, his colonoscopy, which is scheduled for 09/22/23, will be cancelled.

## 2023-09-22 ENCOUNTER — Encounter: Payer: Self-pay | Admitting: Pediatrics
